# Patient Record
Sex: Male | Born: 1970 | Race: Black or African American | Hispanic: No | Marital: Single | Smoking: Never smoker
Health system: Southern US, Community
[De-identification: ages and names within clinical notes are randomized; demographics above are authoritative.]

## PROBLEM LIST (undated history)

## (undated) DIAGNOSIS — J45909 Unspecified asthma, uncomplicated: Secondary | ICD-10-CM

---

## 2018-09-24 ENCOUNTER — Emergency Department (HOSPITAL_COMMUNITY)
Admission: EM | Admit: 2018-09-24 | Discharge: 2018-09-24 | Disposition: A | Discharge status: Home / Self Care | Payer: Self-pay | Attending: Emergency Medicine | Admitting: Emergency Medicine

## 2018-09-24 ENCOUNTER — Other Ambulatory Visit: Payer: Self-pay

## 2018-09-24 ENCOUNTER — Emergency Department (HOSPITAL_COMMUNITY): Payer: Self-pay

## 2018-09-24 ENCOUNTER — Encounter (HOSPITAL_COMMUNITY): Payer: Self-pay

## 2018-09-24 DIAGNOSIS — R51 Headache: Secondary | ICD-10-CM | POA: Insufficient documentation

## 2018-09-24 DIAGNOSIS — J45909 Unspecified asthma, uncomplicated: Secondary | ICD-10-CM | POA: Insufficient documentation

## 2018-09-24 DIAGNOSIS — M7918 Myalgia, other site: Secondary | ICD-10-CM

## 2018-09-24 DIAGNOSIS — H16001 Unspecified corneal ulcer, right eye: Secondary | ICD-10-CM | POA: Insufficient documentation

## 2018-09-24 DIAGNOSIS — R52 Pain, unspecified: Secondary | ICD-10-CM

## 2018-09-24 HISTORY — DX: Unspecified asthma, uncomplicated: J45.909

## 2018-09-24 MED ORDER — CIPROFLOXACIN HCL 0.3 % OP SOLN
2.0000 [drp] | Freq: Once | OPHTHALMIC | Status: AC
  Administered 2018-09-24: 2 [drp] via OPHTHALMIC
  Filled 2018-09-24: qty 2.5

## 2018-09-24 MED ORDER — FLUORESCEIN SODIUM 1 MG OP STRP
1.0000 | ORAL_STRIP | Freq: Once | OPHTHALMIC | Status: AC
  Administered 2018-09-24: 1 via OPHTHALMIC
  Filled 2018-09-24: qty 1

## 2018-09-24 MED ORDER — METHOCARBAMOL 500 MG PO TABS: 500.0000 mg | ORAL_TABLET | Freq: Two times a day (BID) | ORAL | 0 refills | Status: AC

## 2018-09-24 MED ORDER — TETRACAINE HCL 0.5 % OP SOLN
2.0000 [drp] | Freq: Once | OPHTHALMIC | Status: AC
  Administered 2018-09-24: 2 [drp] via OPHTHALMIC
  Filled 2018-09-24: qty 4

## 2018-09-24 NOTE — ED Notes (Signed)
Patient verbalizes understanding of discharge instructions. Opportunity for questioning and answers were provided. Armband removed by staff, pt discharged from ED. Pt ambulatory to lobby.  
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
## 2018-09-24 NOTE — ED Notes (Signed)
Right eye 20/32, Left eye 20/20
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
## 2018-09-24 NOTE — Discharge Instructions (Addendum)
Please do not use the eye patch and do not wear your contacts until treatment is completed.  Please use eye drops as indicated below: (Ophthalmic solution 0.3%) 2 drops in the affected eye(s) every 15 minutes for the first 6 hours, then 2 drops every 30 minutes for the remainder of first day; day two, 2 drops every hour; days 3 through 14, two drops every 4 hours. . May continue treatment after 14 days if corneal re-epithelialization has not occurred  Robaxin is the muscle relaxer I have prescribed, this is meant to help with muscle tightness. Be aware that this medication may make you drowsy therefore the first time you take this it should be at a time you are in an environment where you can rest. Do not drive or operate heavy machinery when taking this medication.   Contact your ophthalmologist tomorrow to make an appointment for follow-up.  If you are unable to make an appoint with your ophthalmologist then you should call the ophthalmologist that was given as referral to you on your discharge paperwork.  Please follow-up with your regular doctor in 1 week and return to the ER for new or worsening symptoms in the meantime.
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
